# Patient Record
Sex: Female | Born: 2000 | Hispanic: Yes | Marital: Single | Smoking: Never smoker
Health system: Southern US, Community
[De-identification: ages and names within clinical notes are randomized; demographics above are authoritative.]

## PROBLEM LIST (undated history)

## (undated) DIAGNOSIS — Q909 Down syndrome, unspecified: Secondary | ICD-10-CM

## (undated) HISTORY — PX: CARDIAC SURGERY: SHX584

---
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
## 2023-02-25 IMAGING — CT CT HEAD W/O CM
3 series · 16 of 46 positions shown, 19 images · non-contrast
Comparison: No priors.

CLINICAL DATA: 20-year-old female with history of syncope.

EXAM:
CT HEAD WITHOUT CONTRAST
TECHNIQUE: Contiguous axial images were obtained from the base of the skull
through the vertex without intravenous contrast.

[Series 2: head w o · axial · 0.39mm/px · z∈[+1092,+1212]mm · 10 of 29 slices shown, 13 images]
[im 3/29  brain]
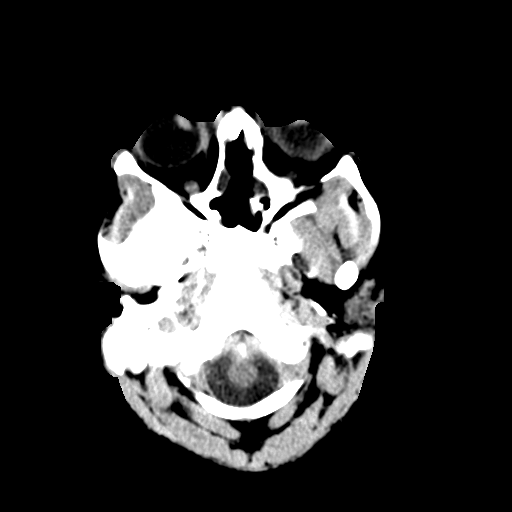
[im 3/29  bone]
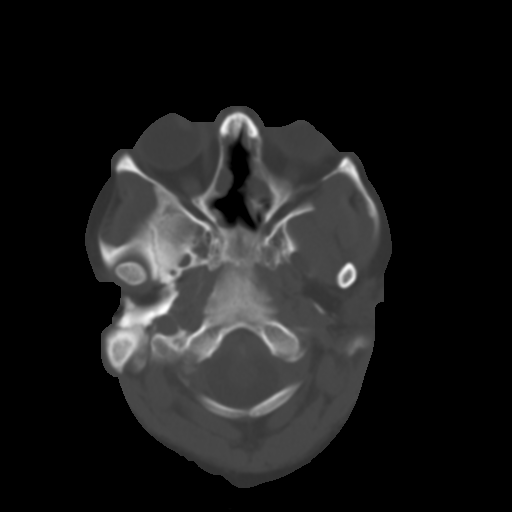
[im 6/29  brain]
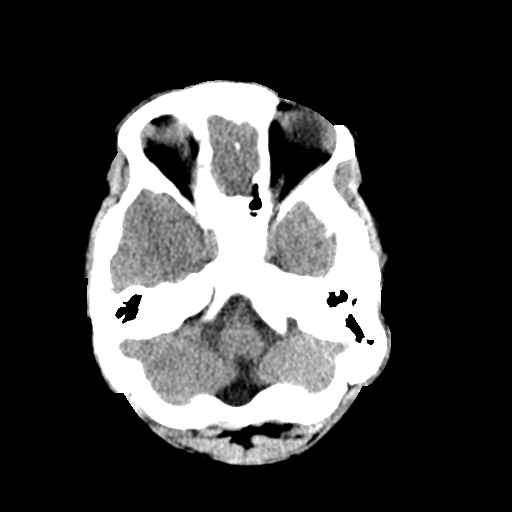
[im 8/29  brain]
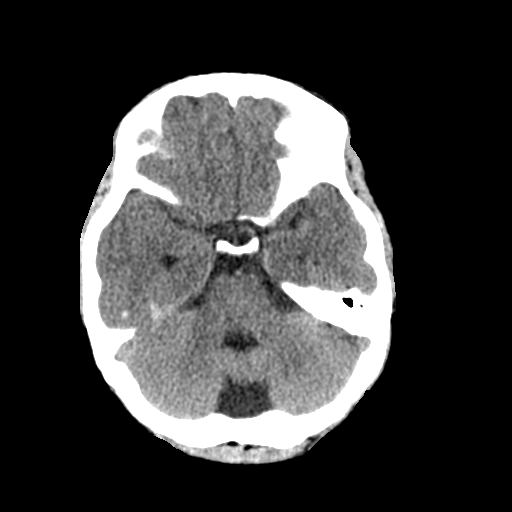
[im 11/29  brain]
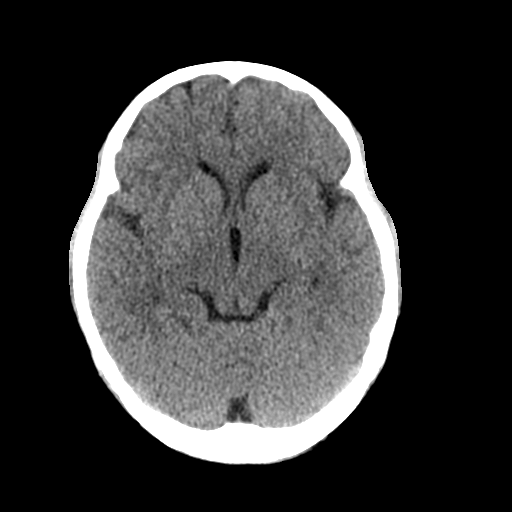
[im 14/29  brain]
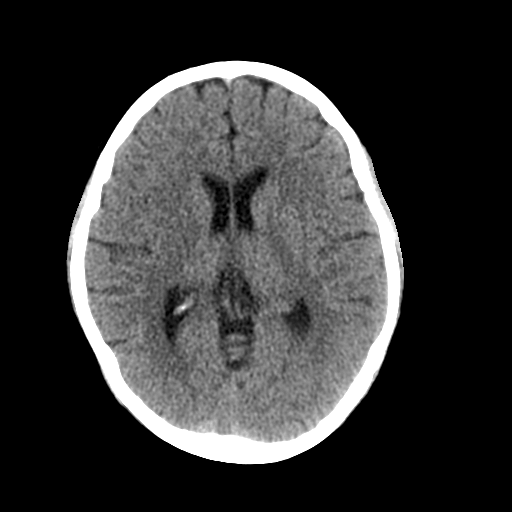
[im 14/29  bone]
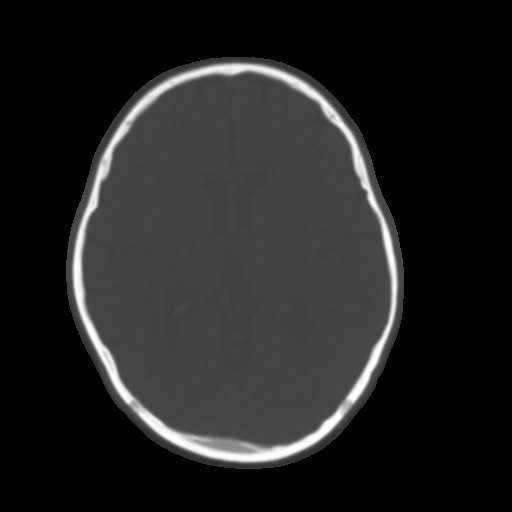
[im 16/29  brain]
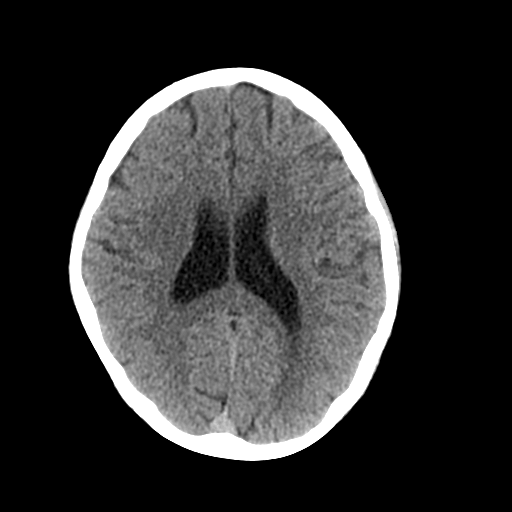
[im 19/29  brain]
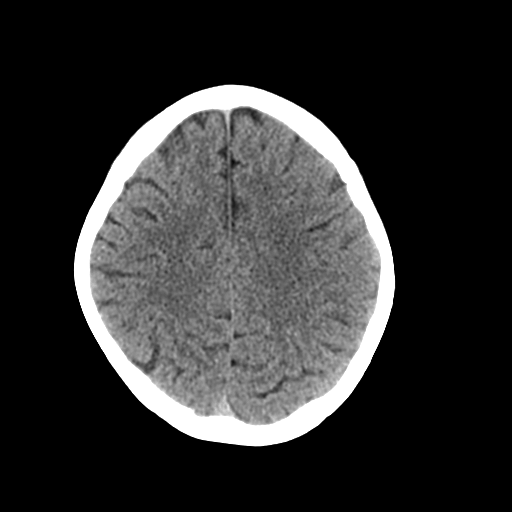
[im 22/29  brain]
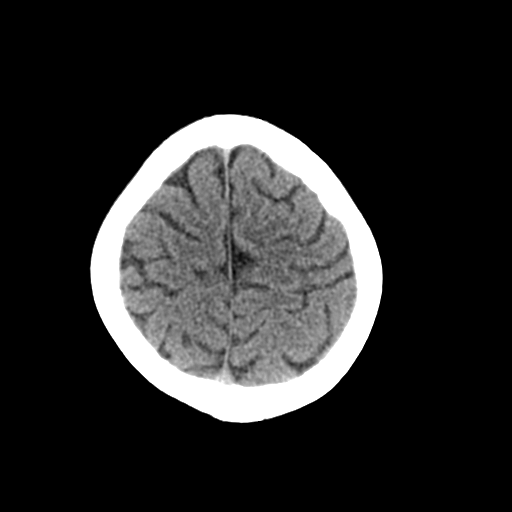
[im 24/29  brain]
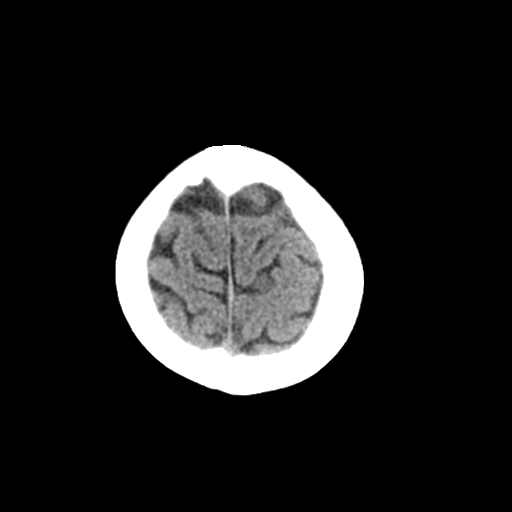
[im 24/29  bone]
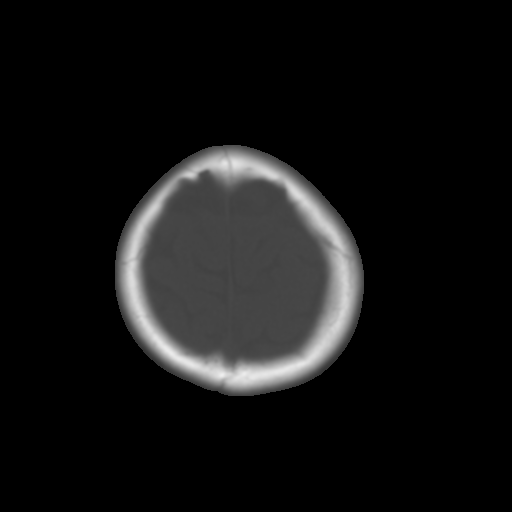
[im 27/29  brain]
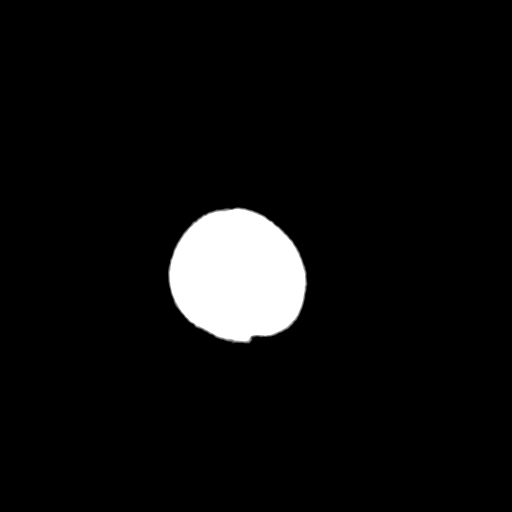

[Series 4: coronal soft · coronal · 0.30mm/px · 3 of 63 slices shown]
[im 21/63  brain]
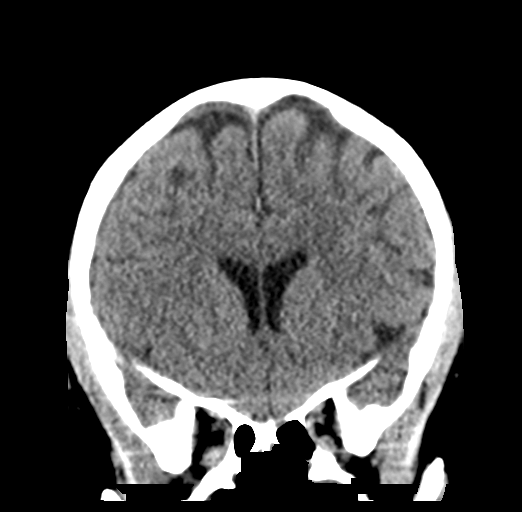
[im 28/63  brain]
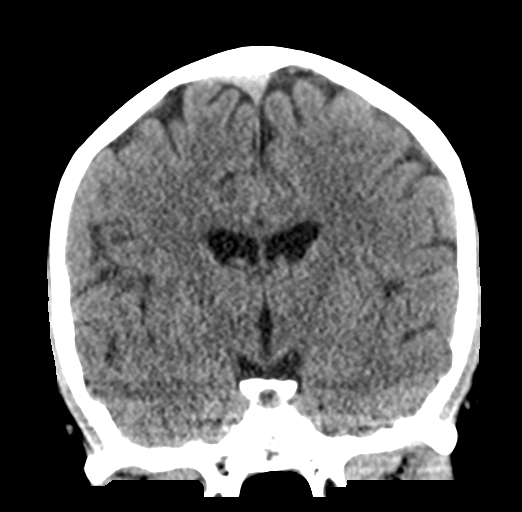
[im 35/63  brain]
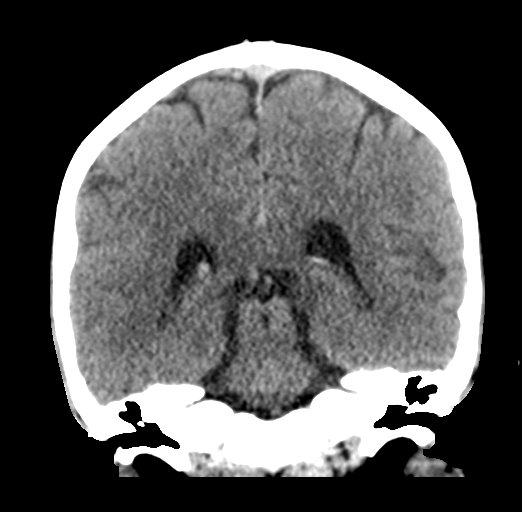

[Series 5: sagittal soft · sagittal · 0.31mm/px · 3 of 50 slices shown]
[im 17/50  brain]
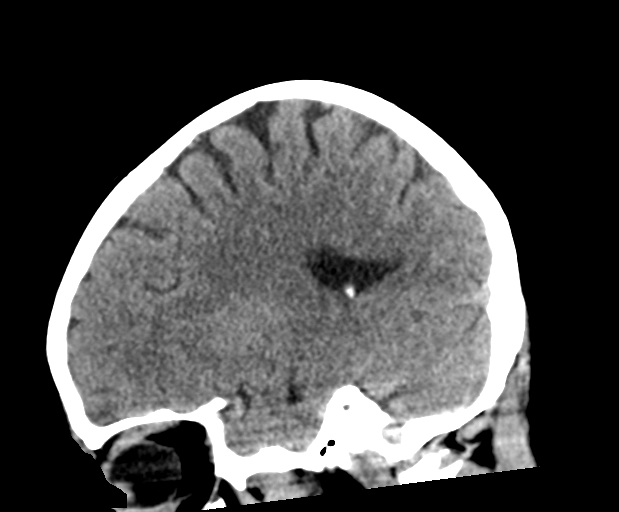
[im 25/50  brain]
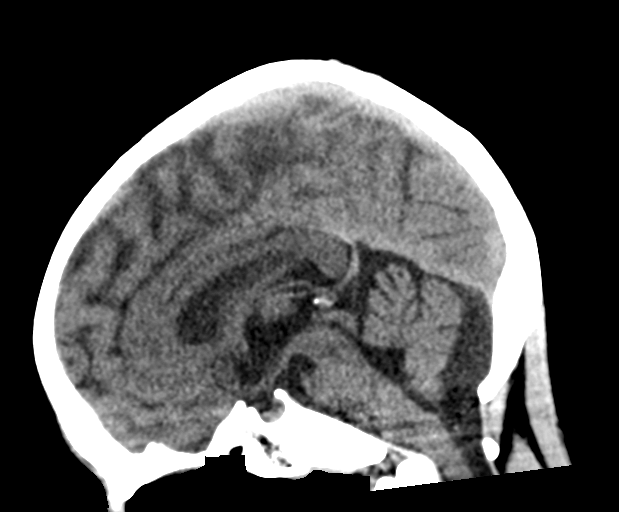
[im 33/50  brain]
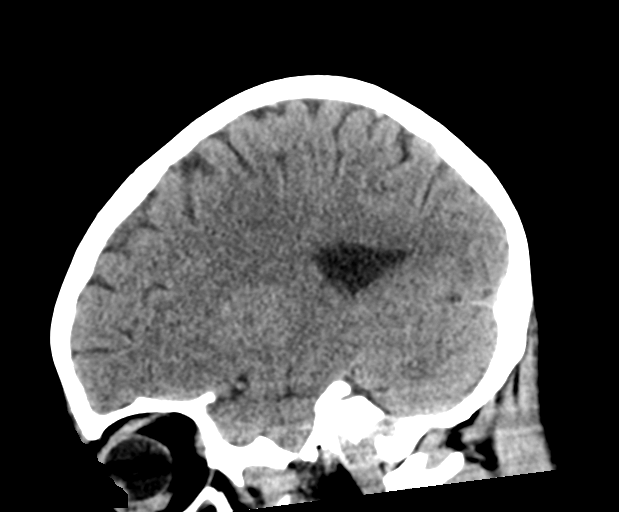

[16 of 46 positions shown; findings below may reference images not displayed]

FINDINGS: Brain: No evidence of acute infarction, hemorrhage, hydrocephalus,
extra-axial collection or mass lesion/mass effect.

Vascular: No hyperdense vessel or unexpected calcification.

Skull: Normal. Negative for fracture or focal lesion.

Sinuses/Orbits: Multifocal mucosal thickening in the visualized
paranasal sinuses. No air-fluid levels are noted.

Other: None.
IMPRESSION: 1. No acute intracranial abnormalities. The appearance of the brain
is normal.
2. Multifocal mucosal thickening in the visualized paranasal
sinuses.
